# Patient Record
Sex: Male | Born: 1985 | Hispanic: Yes | Smoking: Current some day smoker
Health system: Southern US, Community
[De-identification: ages and names within clinical notes are randomized; demographics above are authoritative.]

## PROBLEM LIST (undated history)

## (undated) DIAGNOSIS — Z789 Other specified health status: Secondary | ICD-10-CM

## (undated) DIAGNOSIS — S82891A Other fracture of right lower leg, initial encounter for closed fracture: Secondary | ICD-10-CM

---

## 2015-12-22 ENCOUNTER — Emergency Department (HOSPITAL_COMMUNITY): Payer: Self-pay

## 2015-12-22 ENCOUNTER — Emergency Department (HOSPITAL_COMMUNITY)
Admission: EM | Admit: 2015-12-22 | Discharge: 2015-12-22 | Disposition: A | Discharge status: Home / Self Care | Payer: Self-pay | Attending: Emergency Medicine | Admitting: Emergency Medicine

## 2015-12-22 ENCOUNTER — Encounter (HOSPITAL_COMMUNITY): Payer: Self-pay | Admitting: Emergency Medicine

## 2015-12-22 DIAGNOSIS — R609 Edema, unspecified: Secondary | ICD-10-CM

## 2015-12-22 DIAGNOSIS — F17200 Nicotine dependence, unspecified, uncomplicated: Secondary | ICD-10-CM | POA: Insufficient documentation

## 2015-12-22 DIAGNOSIS — S82891A Other fracture of right lower leg, initial encounter for closed fracture: Secondary | ICD-10-CM

## 2015-12-22 DIAGNOSIS — Q899 Congenital malformation, unspecified: Secondary | ICD-10-CM

## 2015-12-22 DIAGNOSIS — R52 Pain, unspecified: Secondary | ICD-10-CM

## 2015-12-22 DIAGNOSIS — Y9389 Activity, other specified: Secondary | ICD-10-CM | POA: Insufficient documentation

## 2015-12-22 DIAGNOSIS — Y9289 Other specified places as the place of occurrence of the external cause: Secondary | ICD-10-CM | POA: Insufficient documentation

## 2015-12-22 DIAGNOSIS — Y998 Other external cause status: Secondary | ICD-10-CM | POA: Insufficient documentation

## 2015-12-22 DIAGNOSIS — X58XXXA Exposure to other specified factors, initial encounter: Secondary | ICD-10-CM | POA: Insufficient documentation

## 2015-12-22 DIAGNOSIS — S82831A Other fracture of upper and lower end of right fibula, initial encounter for closed fracture: Secondary | ICD-10-CM | POA: Insufficient documentation

## 2015-12-22 MED ORDER — PROPOFOL 10 MG/ML IV BOLUS
200.0000 mg | Freq: Once | INTRAVENOUS | Status: AC
  Administered 2015-12-22: 200 mg via INTRAVENOUS
  Filled 2015-12-22: qty 20

## 2015-12-22 MED ORDER — OXYCODONE-ACETAMINOPHEN 5-325 MG PO TABS: 1.0000 | ORAL_TABLET | Freq: Four times a day (QID) | ORAL | Status: DC | PRN

## 2015-12-22 MED ORDER — MORPHINE SULFATE (PF) 4 MG/ML IV SOLN
4.0000 mg | Freq: Once | INTRAVENOUS | Status: DC
  Filled 2015-12-22: qty 1

## 2015-12-22 NOTE — ED Notes (Signed)
Pt ambulated with ortho tech to door and back, became dizzy and nauseated. Asking for ice water-- sips given.

## 2015-12-22 NOTE — ED Notes (Signed)
Deformity of rt ankle noted good pedal pulse foot color good.  Pt sleeping

## 2015-12-22 NOTE — Progress Notes (Signed)
Orthopedic Tech Progress Note Patient Details:  Grant Leon 06-Feb-1986 [id number]  Ortho Devices Type of Ortho Device: Crutches Ortho Device/Splint Interventions: Application   [name] 12/22/2015, 9:08 AM
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
## 2015-12-22 NOTE — ED Notes (Signed)
The pt did not sign a procedure permit  Too intoxicated  To follow instructions.  Sleeping even though he had no medications until  He was given propofol  To reduce the rt ankle fracture

## 2015-12-22 NOTE — Discharge Instructions (Signed)
Elevate your legs. Use ice packs to keep the swelling down. Leave the splint in place until you are rechecked by the orthopedist. Do not put weight on the splint or it will break. Use the crutches to get around. Takes Percocet for pain as needed. Call the orthopedic office Monday morning to get an appointment to see him that day. You will need surgery to repair your ankle.   Cuidados del yeso o la frula (Cast or Splint Care) El yeso y las frulas sostienen los miembros lesionados y evitan que los huesos se muevan hasta que se curen.  CUIDADOS EN EL HOGAR  Mantenga el yeso o la frula al descubierto durante el tiempo de secado.  El yeso tarda entre 14 y 48 horas en secarse.  La fibra de vidrio se seca en menos de 1 hora.  No apoye el yeso sobre nada que sea ms duro que una almohada durante 24 horas.  No soporte ningn peso sobre el [employer]. No haga presin sobre el yeso. Espere a que el mdico lo autorice.  Mantenga el yeso o la frula secos.  Cbralos con una bolsa plstica cuando se d un bao o los [address]  [address] de [place].  Si tiene Corporate treasurer trax y la cintura (el tronco) bese con una esponja hasta que se lo quiten.  Si el yeso se moja, squelo con una toalla o un secador de cabello. Utilice el aire fro del secador.  Mantenga el yeso o la frula limpios. Limpie el yeso sucio con un pao hmedo.  Noponga objetos extraos debajo del yeso o de la frula.  No se rasque la piel por debajo del molde con ningn objeto. Si siente picazn, use un secador de cabello con aire fro sobre la zona que pica.  No recorte ni perfore el yeso.  No retire el relleno acolchado que se encuentra debajo del yeso.  Ejercite como le ha indicado el mdico las articulaciones que se encuentran cerca del yeso.  Eleve (levante) el miembro lesionado sobre 1  2 almohadas durante los primeros 1 a 3 das. SOLICITE AYUDA SI:  El yeso o la frula se quiebran.  Siente que el yeso o la frula  estn muy apretados o muy flojos.  Siente una picazn intensa por debajo del yeso.  El yeso se moja o tiene una zona blanda.  Siente un feo [employer] proviene del interior del [place].  Algn objeto se queda atascado bajo el yeso.  La piel que rodea el yeso enrojece o se vuelve sensible.  Le aparece dolor, o siente ms dolor luego de la colocacin del yeso. SOLICITE AYUDA DE INMEDIATO SI:  Observa un lquido que sale por el yeso.  No puede mover los dedos.  Los dedos estn de color azul o blanco, estn fros, le duelen y estn inflamados (hinchados).  Siente hormigueo o pierde la sensibilidad (adormecimiento) alrededor de la zona de la lesin.  Aumenta el dolor o la presin debajo del yeso.  Tiene problemas para respirar o Company secretary.  Siente dolor en el pecho.   Esta informacin no tiene Theme park manager el consejo del mdico. Asegrese de hacerle al mdico cualquier pregunta que tenga.   Document Released: 04/28/2011 Document Revised: 07/27/2013 Elsevier Interactive Patient Education 2016 [employer].  Crioterapia  (Cryotherapy)  La crioterapia consiste en aplicar hielo en una lesin. El hielo ayuda a Teacher, early years/pre y la hinchazn despus de una lesin. Hace ms efecto cuando si se comienza  a usar en las primeras 24 a 48 horas.  CUIDADOS EN EL HOGAR   Ponga una toalla seca o hmeda entre el hielo y la piel.  Puede presionar suavemente sobre el hielo.  Deje el hielo no ms de 10 a 20 minutos a una hora.  Revise la piel despus de 5 minutos para asegurarse de que est bien.  Descanse al menos 20 minutos entre las aplicaciones de hielo.  Suspenda el uso si la piel pierde la sensibilidad (adormecimiento).  No use hielo en alguien que no pueda decir cuando le duele. Aqu se incluye a los nios pequeos y a las personas con problemas de memoria (demencia). SOLICITE AYUDA DE INMEDIATO SI:   Tiene manchas blancas en la piel.  La piel est azul o  plida.  Siente que la piel est dura o similar a la cera.  La hinchazn empeora. ASEGRESE DE QUE:   Comprende estas instrucciones.  Controlar su enfermedad.  Solicitar ayuda de inmediato si no mejora o si empeora.   Esta informacin no tiene Theme park managercomo fin reemplazar el consejo del mdico. Asegrese de hacerle al mdico cualquier pregunta que tenga.   Document Released: 11/13/2011 Document Revised: 02/16/2012 Elsevier Interactive Patient Education [employer] Elsevier Inc.

## 2015-12-22 NOTE — ED Notes (Signed)
Pt s ride arrives and discharge instructions given through interpretor. Pt denies questions about crutches. And repeats through interpretor that he will not put weight on this ankle.

## 2015-12-22 NOTE — ED Notes (Signed)
Respirations  Listed on vitals at this time is listed as 10,.    Not accurate  R.  16
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
## 2015-12-22 NOTE — ED Notes (Signed)
Pt opens eyes when name called otherwise sleeping

## 2015-12-22 NOTE — ED Notes (Signed)
The pt was unable to sign a consent for procedure because he was so intoxicated he was asleep  Without any medication given.  He would respond if his name was called loudly only..Marland Kitchen

## 2015-12-22 NOTE — ED Notes (Signed)
More alert at present  Rt leg had fallwn off the pillow  Replaced on pillow with ice packs.,  Pt fell back to sleep immediately

## 2015-12-22 NOTE — ED Notes (Signed)
Portable xrays post reduction   Toes pink good capillary refill  Casted and ice packs placed

## 2015-12-22 NOTE — ED Notes (Signed)
Pt awake asking for the urinal  Otherwise sleeping

## 2015-12-22 NOTE — ED Notes (Signed)
Cap refill of toes immediate. Pt awake and calling for ride.

## 2015-12-22 NOTE — ED Notes (Addendum)
Dr Lynelle Doctorknapp gave 60mg  iv push  yjen 40mg  iv push  Then 20mg  iv push  Over 3minutes  sats 100%

## 2015-12-24 ENCOUNTER — Other Ambulatory Visit (HOSPITAL_COMMUNITY): Payer: Self-pay | Admitting: Orthopedic Surgery

## 2015-12-26 ENCOUNTER — Encounter (HOSPITAL_COMMUNITY): Payer: Self-pay | Admitting: *Deleted

## 2015-12-26 NOTE — Progress Notes (Signed)
Pt SDW -pre-op call completed by pt and [employer], Mindy # [phone]. Pt denies SOB, chest pain, and being under the care of a cardiologist. Pt denies having a stress test, echo and cardiac cath. Pt denies having a chest x ray within the last year. Pt made aware to stop  taking Aspirin, otc vitamins, fish oil and herbal medications. Do not take any NSAIDs ie: Ibuprofen, Advil, Naproxen or any medication containing Aspirin. Pt verbalized understanding of all pre-op instructions. Anesthesia to review EKG.

## 2015-12-27 ENCOUNTER — Ambulatory Visit (HOSPITAL_COMMUNITY): Payer: Self-pay

## 2015-12-27 ENCOUNTER — Encounter (HOSPITAL_COMMUNITY): Payer: Self-pay | Admitting: *Deleted

## 2015-12-27 ENCOUNTER — Observation Stay (HOSPITAL_COMMUNITY)
Admission: RE | Admit: 2015-12-27 | Discharge: 2015-12-28 | Disposition: A | Discharge status: Home / Self Care | Payer: Self-pay | Source: Ambulatory Visit | Attending: Orthopedic Surgery | Admitting: Orthopedic Surgery

## 2015-12-27 ENCOUNTER — Ambulatory Visit (HOSPITAL_COMMUNITY): Payer: Self-pay | Admitting: Emergency Medicine

## 2015-12-27 ENCOUNTER — Observation Stay (HOSPITAL_COMMUNITY): Payer: Self-pay

## 2015-12-27 ENCOUNTER — Encounter (HOSPITAL_COMMUNITY)
Admission: RE | Disposition: A | Discharge status: Home / Self Care | Payer: Self-pay | Source: Ambulatory Visit | Attending: Orthopedic Surgery

## 2015-12-27 DIAGNOSIS — S8263XA Displaced fracture of lateral malleolus of unspecified fibula, initial encounter for closed fracture: Principal | ICD-10-CM | POA: Insufficient documentation

## 2015-12-27 DIAGNOSIS — F17210 Nicotine dependence, cigarettes, uncomplicated: Secondary | ICD-10-CM | POA: Insufficient documentation

## 2015-12-27 DIAGNOSIS — S82899A Other fracture of unspecified lower leg, initial encounter for closed fracture: Secondary | ICD-10-CM | POA: Diagnosis present

## 2015-12-27 DIAGNOSIS — S82891A Other fracture of right lower leg, initial encounter for closed fracture: Secondary | ICD-10-CM

## 2015-12-27 DIAGNOSIS — X58XXXA Exposure to other specified factors, initial encounter: Secondary | ICD-10-CM | POA: Insufficient documentation

## 2015-12-27 HISTORY — PX: ORIF ANKLE FRACTURE: SHX5408

## 2015-12-27 HISTORY — DX: Other fracture of right lower leg, initial encounter for closed fracture: S82.891A

## 2015-12-27 SURGERY — OPEN REDUCTION INTERNAL FIXATION (ORIF) ANKLE FRACTURE
Anesthesia: General | Site: Ankle | Laterality: Right

## 2015-12-27 MED ORDER — CHLORHEXIDINE GLUCONATE 4 % EX LIQD: 60.0000 mL | Freq: Once | CUTANEOUS | Status: DC

## 2015-12-27 MED ORDER — ONDANSETRON HCL 4 MG/2ML IJ SOLN
INTRAMUSCULAR | Status: DC | PRN
  Administered 2015-12-27: 4 mg via INTRAVENOUS

## 2015-12-27 MED ORDER — SODIUM CHLORIDE 0.9 % IJ SOLN
INTRAMUSCULAR | Status: AC
  Filled 2015-12-27: qty 10

## 2015-12-27 MED ORDER — 0.9 % SODIUM CHLORIDE (POUR BTL) OPTIME
TOPICAL | Status: DC | PRN
  Administered 2015-12-27 (×2): 1000 mL

## 2015-12-27 MED ORDER — PROPOFOL 10 MG/ML IV BOLUS
INTRAVENOUS | Status: AC
  Filled 2015-12-27: qty 20

## 2015-12-27 MED ORDER — CEFAZOLIN SODIUM-DEXTROSE 2-3 GM-% IV SOLR
INTRAVENOUS | Status: AC
  Filled 2015-12-27: qty 50

## 2015-12-27 MED ORDER — SUCCINYLCHOLINE CHLORIDE 20 MG/ML IJ SOLN
INTRAMUSCULAR | Status: AC
  Filled 2015-12-27: qty 1

## 2015-12-27 MED ORDER — LACTATED RINGERS IV SOLN
INTRAVENOUS | Status: DC | PRN
  Administered 2015-12-27: 21:00:00 via INTRAVENOUS

## 2015-12-27 MED ORDER — ARTIFICIAL TEARS OP OINT
TOPICAL_OINTMENT | OPHTHALMIC | Status: AC
  Filled 2015-12-27: qty 3.5

## 2015-12-27 MED ORDER — EPHEDRINE SULFATE 50 MG/ML IJ SOLN
INTRAMUSCULAR | Status: AC
  Filled 2015-12-27: qty 1

## 2015-12-27 MED ORDER — MIDAZOLAM HCL 2 MG/2ML IJ SOLN
INTRAMUSCULAR | Status: AC
  Filled 2015-12-27: qty 2

## 2015-12-27 MED ORDER — ONDANSETRON HCL 4 MG/2ML IJ SOLN
INTRAMUSCULAR | Status: AC
  Filled 2015-12-27: qty 2

## 2015-12-27 MED ORDER — CEFAZOLIN SODIUM-DEXTROSE 2-3 GM-% IV SOLR
INTRAVENOUS | Status: DC | PRN
  Administered 2015-12-27: 2 g via INTRAVENOUS

## 2015-12-27 MED ORDER — DEXAMETHASONE SODIUM PHOSPHATE 4 MG/ML IJ SOLN
INTRAMUSCULAR | Status: AC
  Filled 2015-12-27: qty 1

## 2015-12-27 MED ORDER — LIDOCAINE HCL (CARDIAC) 20 MG/ML IV SOLN
INTRAVENOUS | Status: AC
  Filled 2015-12-27: qty 5

## 2015-12-27 MED ORDER — ROCURONIUM BROMIDE 50 MG/5ML IV SOLN
INTRAVENOUS | Status: AC
  Filled 2015-12-27: qty 1

## 2015-12-27 MED ORDER — SUFENTANIL CITRATE 50 MCG/ML IV SOLN
INTRAVENOUS | Status: DC | PRN
  Administered 2015-12-27 (×2): 10 ug via INTRAVENOUS
  Administered 2015-12-27: 5 ug via INTRAVENOUS

## 2015-12-27 MED ORDER — PROPOFOL 10 MG/ML IV BOLUS
INTRAVENOUS | Status: DC | PRN
  Administered 2015-12-27: 200 mg via INTRAVENOUS

## 2015-12-27 MED ORDER — LIDOCAINE HCL (CARDIAC) 20 MG/ML IV SOLN
INTRAVENOUS | Status: DC | PRN
  Administered 2015-12-27: 100 mg via INTRAVENOUS

## 2015-12-27 MED ORDER — LACTATED RINGERS IV SOLN: INTRAVENOUS | Status: DC

## 2015-12-27 MED ORDER — CEFAZOLIN SODIUM-DEXTROSE 2-3 GM-% IV SOLR: 2.0000 g | INTRAVENOUS | Status: DC

## 2015-12-27 MED ORDER — SUFENTANIL CITRATE 50 MCG/ML IV SOLN
INTRAVENOUS | Status: AC
  Filled 2015-12-27: qty 1

## 2015-12-27 MED ORDER — MIDAZOLAM HCL 5 MG/5ML IJ SOLN
INTRAMUSCULAR | Status: DC | PRN
  Administered 2015-12-27: 2 mg via INTRAVENOUS

## 2015-12-27 MED ORDER — DEXAMETHASONE SODIUM PHOSPHATE 4 MG/ML IJ SOLN
INTRAMUSCULAR | Status: DC | PRN
  Administered 2015-12-27: 4 mg via INTRAVENOUS

## 2015-12-27 MED ORDER — FENTANYL CITRATE (PF) 250 MCG/5ML IJ SOLN
INTRAMUSCULAR | Status: AC
  Filled 2015-12-27: qty 5

## 2015-12-27 SURGICAL SUPPLY — 76 items
BANDAGE ACE 4X5 VEL STRL LF (GAUZE/BANDAGES/DRESSINGS) ×3 IMPLANT
BANDAGE ACE 6X5 VEL STRL LF (GAUZE/BANDAGES/DRESSINGS) ×3 IMPLANT
BANDAGE ELASTIC 4 VELCRO ST LF (GAUZE/BANDAGES/DRESSINGS) IMPLANT
BANDAGE ELASTIC 6 VELCRO ST LF (GAUZE/BANDAGES/DRESSINGS) IMPLANT
BIT DRILL 3.5 QC 155 (BIT) ×2 IMPLANT
BIT DRILL 3.5 QC 155MM (BIT) ×1
BIT DRILL QC 2.7 6.3IN  SHORT (BIT) ×4
BIT DRILL QC 2.7 6.3IN SHORT (BIT) ×2 IMPLANT
BLADE SURG 10 STRL SS (BLADE) IMPLANT
BNDG COHESIVE 6X5 TAN STRL LF (GAUZE/BANDAGES/DRESSINGS) ×3 IMPLANT
BNDG ESMARK 4X9 LF (GAUZE/BANDAGES/DRESSINGS) ×3 IMPLANT
BNDG GAUZE ELAST 4 BULKY (GAUZE/BANDAGES/DRESSINGS) IMPLANT
COVER MAYO STAND STRL (DRAPES) ×3 IMPLANT
COVER SURGICAL LIGHT HANDLE (MISCELLANEOUS) ×3 IMPLANT
CUFF TOURNIQUET SINGLE 34IN LL (TOURNIQUET CUFF) IMPLANT
CUFF TOURNIQUET SINGLE 44IN (TOURNIQUET CUFF) IMPLANT
DRAPE C-ARM 42X72 X-RAY (DRAPES) ×3 IMPLANT
DRAPE INCISE IOBAN 66X45 STRL (DRAPES) ×3 IMPLANT
DRAPE SURG 17X23 STRL (DRAPES) IMPLANT
DRAPE U-SHAPE 47X51 STRL (DRAPES) ×3 IMPLANT
DRSG PAD ABDOMINAL 8X10 ST (GAUZE/BANDAGES/DRESSINGS) ×18 IMPLANT
DURAPREP 26ML APPLICATOR (WOUND CARE) ×3 IMPLANT
ELECT REM PT RETURN 9FT ADLT (ELECTROSURGICAL) ×3
ELECTRODE REM PT RTRN 9FT ADLT (ELECTROSURGICAL) ×1 IMPLANT
FACESHIELD WRAPAROUND (MASK) ×3 IMPLANT
GAUZE SPONGE 4X4 12PLY STRL (GAUZE/BANDAGES/DRESSINGS) ×3 IMPLANT
GAUZE XEROFORM 5X9 LF (GAUZE/BANDAGES/DRESSINGS) ×3 IMPLANT
GLOVE BIOGEL PI IND STRL 8 (GLOVE) ×1 IMPLANT
GLOVE BIOGEL PI INDICATOR 8 (GLOVE) ×2
GLOVE SURG ORTHO 8.0 STRL STRW (GLOVE) ×3 IMPLANT
GOWN STRL REUS W/ TWL LRG LVL3 (GOWN DISPOSABLE) ×3 IMPLANT
GOWN STRL REUS W/TWL LRG LVL3 (GOWN DISPOSABLE) ×6
HANDPIECE INTERPULSE COAX TIP (DISPOSABLE)
KIT BASIN OR (CUSTOM PROCEDURE TRAY) ×3 IMPLANT
KIT ROOM TURNOVER OR (KITS) ×3 IMPLANT
MANIFOLD NEPTUNE II (INSTRUMENTS) IMPLANT
NEEDLE HYPO 25GX1X1/2 BEV (NEEDLE) ×3 IMPLANT
NS IRRIG 1000ML POUR BTL (IV SOLUTION) ×3 IMPLANT
PACK ORTHO EXTREMITY (CUSTOM PROCEDURE TRAY) ×3 IMPLANT
PAD ARMBOARD 7.5X6 YLW CONV (MISCELLANEOUS) ×6 IMPLANT
PAD CAST 4YDX4 CTTN HI CHSV (CAST SUPPLIES) IMPLANT
PADDING CAST ABS 4INX4YD NS (CAST SUPPLIES) ×4
PADDING CAST ABS 6INX4YD NS (CAST SUPPLIES) ×4
PADDING CAST ABS COTTON 4X4 ST (CAST SUPPLIES) ×2 IMPLANT
PADDING CAST ABS COTTON 6X4 NS (CAST SUPPLIES) ×2 IMPLANT
PADDING CAST COTTON 4X4 STRL (CAST SUPPLIES)
PLATE FIBULA DISTAL 7 HOLE (Plate) ×3 IMPLANT
SCREW 5.0X12 (Screw) ×3 IMPLANT
SCREW LOCK 10X3.5XST NS (Screw) ×2 IMPLANT
SCREW LOCK 12X3.5XST PRLC (Screw) ×1 IMPLANT
SCREW LOCK 3.5X10 (Screw) ×4 IMPLANT
SCREW LOCK 3.5X12 (Screw) ×2 IMPLANT
SCREW NL 3.5X14 (Screw) ×3 IMPLANT
SCREW NL 3.5X50 (Screw) ×3 IMPLANT
SCREW NL 3.5X55 (Screw) ×3 IMPLANT
SCREW NLCK 16X3.5XST CORT PRLC (Screw) ×1 IMPLANT
SCREW NON LOCK 3.5X12 (Screw) ×9 IMPLANT
SCREW NON LOCKING 3.5X48 (Screw) ×3 IMPLANT
SCREW NONLOCK 3.5X16 (Screw) ×2 IMPLANT
SCREW NONLOCK 3.5X18 (Screw) ×3 IMPLANT
SET HNDPC FAN SPRY TIP SCT (DISPOSABLE) IMPLANT
SPLINT PLASTER CAST XFAST 5X30 (CAST SUPPLIES) ×1 IMPLANT
SPLINT PLASTER XFAST SET 5X30 (CAST SUPPLIES) ×2
STOCKINETTE IMPERVIOUS 9X36 MD (GAUZE/BANDAGES/DRESSINGS) ×3 IMPLANT
SUCTION FRAZIER TIP 10 FR DISP (SUCTIONS) ×3 IMPLANT
SUT ETHILON 3 0 PS 1 (SUTURE) ×12 IMPLANT
SUT VIC AB 2-0 CTB1 (SUTURE) ×9 IMPLANT
SUT VIC AB 3-0 SH 27 (SUTURE) ×4
SUT VIC AB 3-0 SH 27X BRD (SUTURE) ×2 IMPLANT
SYR CONTROL 10ML LL (SYRINGE) ×3 IMPLANT
TOWEL OR 17X24 6PK STRL BLUE (TOWEL DISPOSABLE) ×3 IMPLANT
TOWEL OR 17X26 10 PK STRL BLUE (TOWEL DISPOSABLE) ×3 IMPLANT
TUBE CONNECTING 12'X1/4 (SUCTIONS) ×1
TUBE CONNECTING 12X1/4 (SUCTIONS) ×2 IMPLANT
WATER STERILE IRR 1000ML POUR (IV SOLUTION) ×3 IMPLANT
YANKAUER SUCT BULB TIP NO VENT (SUCTIONS) IMPLANT

## 2015-12-27 NOTE — Anesthesia Procedure Notes (Addendum)
Anesthesia Regional Block:  Popliteal block  Pre-Anesthetic Checklist: ,, timeout performed, Correct Patient, Correct Site, Correct Laterality, Correct Procedure, Correct Position, site marked, Risks and benefits discussed,  Surgical consent,  Pre-op evaluation,  At surgeon's request and post-op pain management  Laterality: Right  Prep: chloraprep       Needles:  Injection technique: Single-shot  Needle Type: Echogenic Stimulator Needle     Needle Length: 9cm 9 cm Needle Gauge: 21 and 21 G    Additional Needles:  Procedures: ultrasound guided (picture in chart) Popliteal block Narrative:  Start time: 12/27/2015 9:25 PM End time: 12/27/2015 9:30 PM Injection made incrementally with aspirations every 5 mL.  Performed by: Personally   Additional Notes: 30 cc 0.5% Bupivacaine with 1:200 epi injected easily   Procedure Name: LMA Insertion Date/Time: 12/27/2015 10:00 PM Performed by: Melina Schools Pre-anesthesia Checklist: Patient identified, Emergency Drugs available, Suction available, Patient being monitored and Timeout performed Patient Re-evaluated:Patient Re-evaluated prior to inductionOxygen Delivery Method: Circle system utilized Preoxygenation: Pre-oxygenation with 100% oxygen Intubation Type: IV induction Ventilation: Mask ventilation without difficulty LMA: LMA inserted LMA Size: 4.0 Number of attempts: 2 Placement Confirmation: positive ETCO2 and breath sounds checked- equal and bilateral Tube secured with: Tape Dental Injury: Teeth and Oropharynx as per pre-operative assessment
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
## 2015-12-27 NOTE — Progress Notes (Signed)
All information obtained through interpreter line, spoke with [place], [place] #[id number].

## 2015-12-27 NOTE — Brief Op Note (Signed)
12/27/2015  11:38 PM  PATIENT:  Grant Leon  30 y.o. male  PRE-OPERATIVE DIAGNOSIS:  RIGHT ANKLE FRACTURE  POST-OPERATIVE DIAGNOSIS:  RIGHT ANKLE FRACTURE  PROCEDURE:  Procedure(s): OPEN REDUCTION INTERNAL FIXATION (ORIF) ANKLE FRACTURE  SURGEON:  Surgeon(s): Cammy Copa, MD  ASSISTANT: April Green RNFA  ANESTHESIA:   general  EBL: 45 ml    Total I/O In: 900 [I.V.:900] Out: -   BLOOD ADMINISTERED: none  DRAINS: none   LOCAL MEDICATIONS USED:  none  SPECIMEN:  No Specimen  COUNTS:  YES  TOURNIQUET:    DICTATION: .Other Dictation: Dictation Number [id number]  PLAN OF CARE: Admit for overnight observation  PATIENT DISPOSITION:  PACU - hemodynamically stable

## 2015-12-27 NOTE — Transfer of Care (Signed)
Immediate Anesthesia Transfer of Care Note  Patient: Grant Leon  Procedure(s) Performed: Procedure(s): OPEN REDUCTION INTERNAL FIXATION (ORIF) ANKLE FRACTURE (Right)  Patient Location: PACU  Anesthesia Type:GA combined with regional for post-op pain  Level of Consciousness: awake, alert  and patient cooperative  Airway & Oxygen Therapy: Patient Spontanous Breathing and Patient connected to nasal cannula oxygen  Post-op Assessment: Report given to RN, Post -op Vital signs reviewed and stable and Patient moving all extremities X 4  Post vital signs: Reviewed and stable  Last Vitals:  Filed Vitals:   12/27/15 1859  BP: 135/86  Pulse: 73  Temp: 37.2 C  Resp: 18    Complications: No apparent anesthesia complications

## 2015-12-27 NOTE — Anesthesia Preprocedure Evaluation (Addendum)
Anesthesia Evaluation    Reviewed: Allergy & Precautions, NPO status , Patient's Chart, lab work & pertinent test results  History of Anesthesia Complications Negative for: history of anesthetic complications  Airway Mallampati: II  TM Distance: >3 FB Neck ROM: Full    Dental  (+) Teeth Intact, Dental Advisory Given   Pulmonary Current Smoker,    breath sounds clear to auscultation       Cardiovascular Exercise Tolerance: Good negative cardio ROS   Rhythm:Regular Rate:Normal     Neuro/Psych negative neurological ROS  negative psych ROS   GI/Hepatic negative GI ROS, Neg liver ROS,   Endo/Other    Renal/GU negative Renal ROS     Musculoskeletal   Abdominal (+)  Abdomen: soft. Bowel sounds: normal.  Peds  Hematology negative hematology ROS (+)   Anesthesia Other Findings   Reproductive/Obstetrics                           EKG 12/22/15: Sinus rhythm ST elev, probable normal early repol pattern  BP Readings from Last 3 Encounters:  12/22/15 132/98   No results found for: WBC, HGB, HCT, MCV, PLT   Chemistry   No results found for: NA, K, CL, CO2, BUN, CREATININE, GLU No results found for: CALCIUM, ALKPHOS, AST, ALT, BILITOT    Anesthesia Physical Anesthesia Plan  ASA: I  Anesthesia Plan: General   Post-op Pain Management: GA combined w/ Regional for post-op pain   Induction: Intravenous  Airway Management Planned: LMA  Additional Equipment:   Intra-op Plan:   Post-operative Plan:   Informed Consent: I have reviewed the patients History and Physical, chart, labs and discussed the procedure including the risks, benefits and alternatives for the proposed anesthesia with the patient or authorized representative who has indicated his/her understanding and acceptance.   Dental advisory given  Plan Discussed with: CRNA and Anesthesiologist  Anesthesia Plan Comments:          Anesthesia Quick Evaluation

## 2015-12-28 ENCOUNTER — Encounter (HOSPITAL_COMMUNITY): Payer: Self-pay | Admitting: *Deleted

## 2015-12-28 MED ORDER — HYDROMORPHONE HCL 1 MG/ML IJ SOLN
0.2500 mg | INTRAMUSCULAR | Status: DC | PRN
  Administered 2015-12-28: 0.5 mg via INTRAVENOUS

## 2015-12-28 MED ORDER — ACETAMINOPHEN 650 MG RE SUPP: 650.0000 mg | Freq: Four times a day (QID) | RECTAL | Status: DC | PRN

## 2015-12-28 MED ORDER — METHOCARBAMOL 500 MG PO TABS: 500.0000 mg | ORAL_TABLET | Freq: Four times a day (QID) | ORAL | Status: DC | PRN

## 2015-12-28 MED ORDER — ONDANSETRON HCL 4 MG/2ML IJ SOLN: 4.0000 mg | Freq: Once | INTRAMUSCULAR | Status: DC | PRN

## 2015-12-28 MED ORDER — OXYCODONE HCL 5 MG PO TABS: 5.0000 mg | ORAL_TABLET | ORAL | Status: DC | PRN

## 2015-12-28 MED ORDER — MORPHINE SULFATE (PF) 4 MG/ML IV SOLN
4.0000 mg | INTRAVENOUS | Status: DC | PRN
  Administered 2015-12-28: 4 mg via INTRAVENOUS
  Filled 2015-12-28: qty 1

## 2015-12-28 MED ORDER — METHOCARBAMOL 1000 MG/10ML IJ SOLN: 500.0000 mg | Freq: Four times a day (QID) | INTRAVENOUS | Status: DC | PRN

## 2015-12-28 MED ORDER — OXYCODONE HCL 5 MG/5ML PO SOLN: 5.0000 mg | Freq: Once | ORAL | Status: DC | PRN

## 2015-12-28 MED ORDER — RIVAROXABAN 10 MG PO TABS
10.0000 mg | ORAL_TABLET | Freq: Every day | ORAL | Status: DC
  Administered 2015-12-28: 10 mg via ORAL
  Filled 2015-12-28: qty 1

## 2015-12-28 MED ORDER — ONDANSETRON HCL 4 MG/2ML IJ SOLN: 4.0000 mg | Freq: Four times a day (QID) | INTRAMUSCULAR | Status: DC | PRN

## 2015-12-28 MED ORDER — ACETAMINOPHEN 325 MG PO TABS: 650.0000 mg | ORAL_TABLET | Freq: Four times a day (QID) | ORAL | Status: DC | PRN

## 2015-12-28 MED ORDER — HYDROMORPHONE HCL 1 MG/ML IJ SOLN
INTRAMUSCULAR | Status: AC
  Filled 2015-12-28: qty 1

## 2015-12-28 MED ORDER — ONDANSETRON HCL 4 MG PO TABS: 4.0000 mg | ORAL_TABLET | Freq: Four times a day (QID) | ORAL | Status: DC | PRN

## 2015-12-28 MED ORDER — PNEUMOCOCCAL VAC POLYVALENT 25 MCG/0.5ML IJ INJ: 0.5000 mL | INJECTION | INTRAMUSCULAR | Status: DC

## 2015-12-28 MED ORDER — CEFAZOLIN SODIUM 1-5 GM-% IV SOLN
1.0000 g | Freq: Four times a day (QID) | INTRAVENOUS | Status: AC
  Administered 2015-12-28 (×2): 1 g via INTRAVENOUS
  Filled 2015-12-28 (×2): qty 50

## 2015-12-28 MED ORDER — METOCLOPRAMIDE HCL 5 MG/ML IJ SOLN: 5.0000 mg | Freq: Three times a day (TID) | INTRAMUSCULAR | Status: DC | PRN

## 2015-12-28 MED ORDER — METOCLOPRAMIDE HCL 5 MG PO TABS: 5.0000 mg | ORAL_TABLET | Freq: Three times a day (TID) | ORAL | Status: DC | PRN

## 2015-12-28 MED ORDER — POTASSIUM CHLORIDE IN NACL 20-0.9 MEQ/L-% IV SOLN
INTRAVENOUS | Status: AC
  Administered 2015-12-28: 01:00:00 via INTRAVENOUS
  Filled 2015-12-28: qty 1000

## 2015-12-28 MED ORDER — RIVAROXABAN 10 MG PO TABS: 10.0000 mg | ORAL_TABLET | Freq: Every day | ORAL | Status: DC

## 2015-12-28 MED ORDER — OXYCODONE HCL 5 MG PO TABS: 5.0000 mg | ORAL_TABLET | Freq: Once | ORAL | Status: DC | PRN

## 2015-12-28 NOTE — Op Note (Signed)
NAME:  DANYELL, AWBREY NO.:  [id number]  MEDICAL RECORD NO.:  [id number]  LOCATION:  [facility]                         FACILITY:  [facility]  PHYSICIAN:  Burnard Bunting, M.D.    DATE OF BIRTH:  [birth year]  DATE OF PROCEDURE: DATE OF DISCHARGE:                              OPERATIVE REPORT   PREOPERATIVE DIAGNOSIS:  Right ankle lateral malleolar fracture.  POSTOPERATIVE DIAGNOSES:  Right ankle lateral malleolar fracture and syndesmotic disruption.  PROCEDURE:  Open reduction and internal fixation of lateral malleolar ankle fracture and open reduction and internal fixation of syndesmosis.  SURGEON:  Burnard Bunting, M.D.  ASSISTANT:  April Chilton Si, RNFA.  INDICATIONS:  Roxy Filler is a patient with lateral ankle fracture presents now for operative management after explanation of risks, benefits.  PROCEDURE IN DETAIL:  The patient was brought to operating room with general endotracheal anesthesia induced.  Preoperative antibiotics were administered.  Time-out was called.  Right leg was prescrubbed with alcohol and Betadine, allowed to air dry, prepped with DuraPrep solution and draped in sterile manner.  Collier Flowers was used to cover the operative field.  Leg elevated, exsanguinated with Esmarch wrap.  Ankle Esmarch was utilized for approximately an hour.  Lateral incision was made. Skin and subcutaneous tissues sharply divided.  Care was taken to avoid injury to the branch of superficial peroneal nerve.  Lateral malleolar fracture was identified, irrigated, reduced.  Lag screw placed anterior proximal to posterior distal.  Good reduction was achieved.  Lateral plate applied, 4 screws proximal, cluster locking screws distal, syndesmosis stressed and found to be quite unstable.  Two syndesmotic screws were then placed after reducing the syndesmosis.  The syndesmosis was reduced until there was overlap in the clear space on the mortise. Two bicortical screws were then placed.   Initial attempt demonstrated mal-reduced mortise when the screw aimed anteriorly more straighter trajectory gave a better reduction.  At this time, ankle Esmarch released, 3 L of irrigating solution used to irrigate the incision and the incision closed using 2-0 Vicryl suture, 3-0 nylon.  A well-padded posterior splint was applied.  The patient tolerated the procedure well without immediate complication.  Transferred to recovery room in stable condition.     Burnard Bunting, M.D.    GSD/MEDQ  D:  12/27/2015  T:  12/27/2015  Job:  [id number]

## 2015-12-28 NOTE — Progress Notes (Signed)
Pt ready for discharge. Instructions/education reviewed with pt/family member and all questions/concerns adressed. Pt will be transported out via wheelchair to family member's car. Will continue to monitor

## 2015-12-28 NOTE — Anesthesia Postprocedure Evaluation (Signed)
Anesthesia Post Note  Patient: Grant Leon  Procedure(s) Performed: Procedure(s) (LRB): OPEN REDUCTION INTERNAL FIXATION (ORIF) ANKLE FRACTURE (Right)  Patient location during evaluation: PACU Anesthesia Type: General and Regional Level of consciousness: awake and awake and alert Pain management: pain level controlled Vital Signs Assessment: post-procedure vital signs reviewed and stable Respiratory status: spontaneous breathing and nonlabored ventilation Cardiovascular status: blood pressure returned to baseline Anesthetic complications: no    Last Vitals:  Filed Vitals:   12/28/15 0000 12/28/15 0015  BP: 145/99 144/96  Pulse: 92 87  Temp:    Resp: 13 12    Last Pain:  Filed Vitals:   12/28/15 0017  PainSc: 3         RLE Motor Response: Purposeful movement;Responds to commands (12/28/15 0015)        [name] COKER

## 2015-12-28 NOTE — Progress Notes (Signed)
Pt stable Right foot perfused but block in effect Plan dc today
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
## 2016-03-11 ENCOUNTER — Other Ambulatory Visit: Payer: Self-pay | Admitting: Orthopedic Surgery

## 2016-04-07 ENCOUNTER — Inpatient Hospital Stay (HOSPITAL_COMMUNITY)
Admission: RE | Admit: 2016-04-07 | Discharge: 2016-04-07 | Disposition: A | Discharge status: Home / Self Care | Payer: Self-pay | Source: Ambulatory Visit

## 2016-04-07 NOTE — Progress Notes (Signed)
Nurse called patient several times and patients brother. No answer.

## 2016-04-07 NOTE — Progress Notes (Signed)
Grant MountainMark Washburn from language services [facility] Nurse informed Loraine LericheMark that Nurse attempted to call patient several times, however patient did not answer phone. Grant Leon left his direct call back number [phone]) and informed Nurse that he could return at any time if needed.

## 2016-04-14 ENCOUNTER — Encounter (HOSPITAL_COMMUNITY): Payer: Self-pay | Admitting: *Deleted

## 2016-04-14 NOTE — Progress Notes (Signed)
Pre- op call completed using [employer] [employer], [place], [place] # [id number].

## 2016-04-14 NOTE — Anesthesia Preprocedure Evaluation (Addendum)
Anesthesia Evaluation  Patient identified by MRN, date of birth, ID band Patient awake    Reviewed: Allergy & Precautions, NPO status , Patient's Chart, lab work & pertinent test results  Airway Mallampati: III  TM Distance: >3 FB Neck ROM: Full    Dental  (+) Teeth Intact   Pulmonary Current Smoker,    breath sounds clear to auscultation       Cardiovascular negative cardio ROS   Rhythm:Regular Rate:Normal     Neuro/Psych negative neurological ROS  negative psych ROS   GI/Hepatic negative GI ROS, Neg liver ROS,   Endo/Other  negative endocrine ROS  Renal/GU negative Renal ROS  negative genitourinary   Musculoskeletal negative musculoskeletal ROS (+)   Abdominal Normal abdominal exam  (+)   Peds negative pediatric ROS (+)  Hematology negative hematology ROS (+)   Anesthesia Other Findings   Reproductive/Obstetrics negative OB ROS                          12/2015 EKG: NSR  Anesthesia Physical Anesthesia Plan  ASA: II  Anesthesia Plan: General   Post-op Pain Management:    Induction: Intravenous  Airway Management Planned: LMA  Additional Equipment:   Intra-op Plan:   Post-operative Plan: Extubation in OR  Informed Consent: I have reviewed the patients History and Physical, chart, labs and discussed the procedure including the risks, benefits and alternatives for the proposed anesthesia with the patient or authorized representative who has indicated his/her understanding and acceptance.   Dental advisory given  Plan Discussed with: CRNA  Anesthesia Plan Comments:         Anesthesia Quick Evaluation

## 2016-04-15 ENCOUNTER — Encounter (HOSPITAL_COMMUNITY)
Admission: RE | Disposition: A | Discharge status: Home / Self Care | Payer: Self-pay | Source: Ambulatory Visit | Attending: Orthopedic Surgery

## 2016-04-15 ENCOUNTER — Encounter (HOSPITAL_COMMUNITY): Payer: Self-pay | Admitting: General Practice

## 2016-04-15 ENCOUNTER — Ambulatory Visit (HOSPITAL_COMMUNITY)
Admission: RE | Admit: 2016-04-15 | Discharge: 2016-04-15 | Disposition: A | Discharge status: Home / Self Care | Payer: Self-pay | Source: Ambulatory Visit | Attending: Orthopedic Surgery | Admitting: Orthopedic Surgery

## 2016-04-15 ENCOUNTER — Ambulatory Visit (HOSPITAL_COMMUNITY): Payer: Self-pay | Admitting: Anesthesiology

## 2016-04-15 DIAGNOSIS — M25571 Pain in right ankle and joints of right foot: Secondary | ICD-10-CM | POA: Insufficient documentation

## 2016-04-15 DIAGNOSIS — F17210 Nicotine dependence, cigarettes, uncomplicated: Secondary | ICD-10-CM | POA: Insufficient documentation

## 2016-04-15 DIAGNOSIS — T8484XA Pain due to internal orthopedic prosthetic devices, implants and grafts, initial encounter: Secondary | ICD-10-CM | POA: Insufficient documentation

## 2016-04-15 DIAGNOSIS — Y838 Other surgical procedures as the cause of abnormal reaction of the patient, or of later complication, without mention of misadventure at the time of the procedure: Secondary | ICD-10-CM | POA: Insufficient documentation

## 2016-04-15 HISTORY — DX: Other specified health status: Z78.9

## 2016-04-15 HISTORY — PX: HARDWARE REMOVAL: SHX979

## 2016-04-15 LAB — SURGICAL PCR SCREEN
MRSA, PCR: NEGATIVE
Staphylococcus aureus: POSITIVE — AB

## 2016-04-15 SURGERY — REMOVAL, HARDWARE
Anesthesia: General | Laterality: Right

## 2016-04-15 MED ORDER — FENTANYL CITRATE (PF) 250 MCG/5ML IJ SOLN
INTRAMUSCULAR | Status: AC
  Filled 2016-04-15: qty 5

## 2016-04-15 MED ORDER — PROPOFOL 10 MG/ML IV BOLUS
INTRAVENOUS | Status: AC
  Filled 2016-04-15: qty 40

## 2016-04-15 MED ORDER — PROCHLORPERAZINE EDISYLATE 5 MG/ML IJ SOLN: 10.0000 mg | INTRAMUSCULAR | Status: DC | PRN

## 2016-04-15 MED ORDER — FENTANYL CITRATE (PF) 100 MCG/2ML IJ SOLN
INTRAMUSCULAR | Status: DC | PRN
  Administered 2016-04-15: 50 ug via INTRAVENOUS

## 2016-04-15 MED ORDER — CHLORHEXIDINE GLUCONATE 4 % EX LIQD: 60.0000 mL | Freq: Once | CUTANEOUS | Status: DC

## 2016-04-15 MED ORDER — HYDROMORPHONE HCL 1 MG/ML IJ SOLN: 0.2500 mg | INTRAMUSCULAR | Status: DC | PRN

## 2016-04-15 MED ORDER — LACTATED RINGERS IV SOLN
INTRAVENOUS | Status: DC | PRN
  Administered 2016-04-15: 07:00:00 via INTRAVENOUS

## 2016-04-15 MED ORDER — LIDOCAINE 2% (20 MG/ML) 5 ML SYRINGE
INTRAMUSCULAR | Status: AC
  Filled 2016-04-15: qty 5

## 2016-04-15 MED ORDER — LIDOCAINE HCL (CARDIAC) 20 MG/ML IV SOLN
INTRAVENOUS | Status: DC | PRN
  Administered 2016-04-15: 60 mg via INTRAVENOUS

## 2016-04-15 MED ORDER — MIDAZOLAM HCL 2 MG/2ML IJ SOLN
INTRAMUSCULAR | Status: AC
  Filled 2016-04-15: qty 2

## 2016-04-15 MED ORDER — ONDANSETRON HCL 4 MG/2ML IJ SOLN
INTRAMUSCULAR | Status: DC | PRN
  Administered 2016-04-15: 4 mg via INTRAVENOUS

## 2016-04-15 MED ORDER — HYDROCODONE-ACETAMINOPHEN 5-325 MG PO TABS: 1.0000 | ORAL_TABLET | Freq: Four times a day (QID) | ORAL | Status: AC | PRN

## 2016-04-15 MED ORDER — MEPERIDINE HCL 25 MG/ML IJ SOLN: 6.2500 mg | INTRAMUSCULAR | Status: DC | PRN

## 2016-04-15 MED ORDER — CEFAZOLIN SODIUM-DEXTROSE 2-4 GM/100ML-% IV SOLN
INTRAVENOUS | Status: AC
  Filled 2016-04-15: qty 100

## 2016-04-15 MED ORDER — 0.9 % SODIUM CHLORIDE (POUR BTL) OPTIME
TOPICAL | Status: DC | PRN
  Administered 2016-04-15: 1000 mL

## 2016-04-15 MED ORDER — BUPIVACAINE HCL (PF) 0.25 % IJ SOLN
INTRAMUSCULAR | Status: DC | PRN
  Administered 2016-04-15: 5 mL

## 2016-04-15 MED ORDER — CEFAZOLIN SODIUM-DEXTROSE 2-4 GM/100ML-% IV SOLN
2.0000 g | INTRAVENOUS | Status: AC
  Administered 2016-04-15: 2 g via INTRAVENOUS

## 2016-04-15 MED ORDER — ONDANSETRON HCL 4 MG/2ML IJ SOLN
INTRAMUSCULAR | Status: AC
  Filled 2016-04-15: qty 2

## 2016-04-15 MED ORDER — LACTATED RINGERS IV SOLN: INTRAVENOUS | Status: DC

## 2016-04-15 MED ORDER — MIDAZOLAM HCL 5 MG/5ML IJ SOLN
INTRAMUSCULAR | Status: DC | PRN
  Administered 2016-04-15: 0.5 mg via INTRAVENOUS

## 2016-04-15 MED ORDER — MUPIROCIN 2 % EX OINT
1.0000 "application " | TOPICAL_OINTMENT | Freq: Once | CUTANEOUS | Status: AC
  Administered 2016-04-15: 1 via TOPICAL
  Filled 2016-04-15: qty 22

## 2016-04-15 MED ORDER — BUPIVACAINE HCL (PF) 0.25 % IJ SOLN
INTRAMUSCULAR | Status: AC
  Filled 2016-04-15: qty 30

## 2016-04-15 MED ORDER — PROPOFOL 10 MG/ML IV BOLUS
INTRAVENOUS | Status: DC | PRN
  Administered 2016-04-15: 200 mg via INTRAVENOUS
  Administered 2016-04-15: 100 mg via INTRAVENOUS

## 2016-04-15 SURGICAL SUPPLY — 50 items
BANDAGE ELASTIC 4 VELCRO ST LF (GAUZE/BANDAGES/DRESSINGS) ×3 IMPLANT
BANDAGE ELASTIC 6 VELCRO ST LF (GAUZE/BANDAGES/DRESSINGS) IMPLANT
BANDAGE ESMARK 6X9 LF (GAUZE/BANDAGES/DRESSINGS) IMPLANT
BNDG COHESIVE 4X5 TAN STRL (GAUZE/BANDAGES/DRESSINGS) IMPLANT
BNDG ESMARK 6X9 LF (GAUZE/BANDAGES/DRESSINGS)
BNDG GAUZE ELAST 4 BULKY (GAUZE/BANDAGES/DRESSINGS) ×3 IMPLANT
COVER SURGICAL LIGHT HANDLE (MISCELLANEOUS) ×3 IMPLANT
CUFF TOURNIQUET SINGLE 34IN LL (TOURNIQUET CUFF) IMPLANT
CUFF TOURNIQUET SINGLE 44IN (TOURNIQUET CUFF) IMPLANT
DRAPE C-ARM 42X72 X-RAY (DRAPES) IMPLANT
DRAPE EXTREMITY T 121X128X90 (DRAPE) IMPLANT
DRAPE INCISE IOBAN 66X45 STRL (DRAPES) IMPLANT
DRAPE ORTHO SPLIT 77X108 STRL (DRAPES)
DRAPE PROXIMA HALF (DRAPES) ×3 IMPLANT
DRAPE SURG ORHT 6 SPLT 77X108 (DRAPES) IMPLANT
DRAPE U-SHAPE 47X51 STRL (DRAPES) ×3 IMPLANT
DRSG EMULSION OIL 3X3 NADH (GAUZE/BANDAGES/DRESSINGS) ×3 IMPLANT
DRSG PAD ABDOMINAL 8X10 ST (GAUZE/BANDAGES/DRESSINGS) ×3 IMPLANT
DRSG TEGADERM 4X4.75 (GAUZE/BANDAGES/DRESSINGS) ×3 IMPLANT
ELECT REM PT RETURN 9FT ADLT (ELECTROSURGICAL) ×3
ELECTRODE REM PT RTRN 9FT ADLT (ELECTROSURGICAL) ×1 IMPLANT
GAUZE SPONGE 4X4 12PLY STRL (GAUZE/BANDAGES/DRESSINGS) ×3 IMPLANT
GAUZE XEROFORM 1X8 LF (GAUZE/BANDAGES/DRESSINGS) ×3 IMPLANT
GLOVE BIOGEL PI IND STRL 8 (GLOVE) ×1 IMPLANT
GLOVE BIOGEL PI INDICATOR 8 (GLOVE) ×2
GLOVE SURG ORTHO 8.0 STRL STRW (GLOVE) ×3 IMPLANT
GOWN STRL REUS W/ TWL LRG LVL3 (GOWN DISPOSABLE) ×3 IMPLANT
GOWN STRL REUS W/TWL LRG LVL3 (GOWN DISPOSABLE) ×6
KIT BASIN OR (CUSTOM PROCEDURE TRAY) ×3 IMPLANT
KIT ROOM TURNOVER OR (KITS) ×3 IMPLANT
MANIFOLD NEPTUNE II (INSTRUMENTS) ×3 IMPLANT
NS IRRIG 1000ML POUR BTL (IV SOLUTION) ×3 IMPLANT
PACK GENERAL/GYN (CUSTOM PROCEDURE TRAY) ×3 IMPLANT
PAD ARMBOARD 7.5X6 YLW CONV (MISCELLANEOUS) ×6 IMPLANT
PAD CAST 4YDX4 CTTN HI CHSV (CAST SUPPLIES) ×1 IMPLANT
PADDING CAST COTTON 4X4 STRL (CAST SUPPLIES) ×2
PADDING CAST COTTON 6X4 STRL (CAST SUPPLIES) ×3 IMPLANT
STAPLER VISISTAT 35W (STAPLE) ×3 IMPLANT
STOCKINETTE IMPERVIOUS 9X36 MD (GAUZE/BANDAGES/DRESSINGS) IMPLANT
SUCTION FRAZIER HANDLE 10FR (MISCELLANEOUS) ×2
SUCTION TUBE FRAZIER 10FR DISP (MISCELLANEOUS) ×1 IMPLANT
SUT ETHILON 4 0 FS 1 (SUTURE) IMPLANT
SUT VIC AB 0 CT1 27 (SUTURE)
SUT VIC AB 0 CT1 27XBRD ANBCTR (SUTURE) IMPLANT
SUT VIC AB 2-0 CT1 27 (SUTURE)
SUT VIC AB 2-0 CT1 TAPERPNT 27 (SUTURE) IMPLANT
SYR CONTROL 10ML LL (SYRINGE) ×3 IMPLANT
TOWEL OR 17X24 6PK STRL BLUE (TOWEL DISPOSABLE) ×3 IMPLANT
TOWEL OR 17X26 10 PK STRL BLUE (TOWEL DISPOSABLE) ×3 IMPLANT
WATER STERILE IRR 1000ML POUR (IV SOLUTION) ×3 IMPLANT

## 2016-04-15 NOTE — Op Note (Signed)
NAME:  Grant Leon, Vidit                 ACCOUNT NO.:  [id number]  MEDICAL RECORD NO.:  [id number]  LOCATION:  [facility]                         FACILITY:  [facility]  PHYSICIAN:  Burnard BuntingG. Scott Junior Kenedy, M.D.    DATE OF BIRTH:  07/04/1986  DATE OF PROCEDURE: DATE OF DISCHARGE:  04/15/2016                              OPERATIVE REPORT   PREOPERATIVE DIAGNOSIS:  Right ankle retained hardware, syndesmotic fixation.  POSTOPERATIVE DIAGNOSIS:  Right ankle retained hardware, syndesmotic fixation.  PROCEDURE:  Removal of hardware, syndesmotic screw.  SURGEON:  Burnard BuntingG. Scott Kelyn Ponciano, M.D.  ASSISTANT:  None.  ANESTHESIA:  General.  INDICATIONS:  [name] is a 30 year old patient with retained hardware following syndesmotic fixation and ankle fracture fixation 3 months ago, presents for operative management and removal of hardware.  PROCEDURE IN DETAIL:  The patient was brought to the operating room where general anesthetic was induced.  Preoperative antibiotics were administered.  Time-out was called.  Right leg was prescrubbed with alcohol and Betadine, allowed to air dry, prepped with DuraPrep solution and draped in a sterile manner.  Collier Flowersoban was used to cover the operative field.  Ankle Esmarch utilized for approximately 5 minutes.  Under fluoroscopic localization, two screws were localized.  Skin and subcutaneous tissue were divided.  Fascia was divided.  Deep hardware removed from the lateral plate intact.  Thorough irrigation performed. Ankle stress, syndesmosis stable.  Tourniquet ankle Esmarch released. Bleeding points were encountered and controlled using electrocautery. Skin edges were anesthetized using plain Marcaine.  The incision was then closed using 2-0 Vicryl, 3-0 nylon.  Impervious dressing plus Ace wrap applied.  Weightbearing as tolerated postop.  Follow up with me in 10 days for suture removal.     Burnard BuntingG. Scott Koki Buxton, M.D.     GSD/MEDQ  D:  04/15/2016  T:  04/15/2016  Job:  [id number]

## 2016-04-15 NOTE — Anesthesia Procedure Notes (Signed)
Procedure Name: LMA Insertion Date/Time: 04/15/2016 8:44 AM Performed by: Marni GriffonJAMES, Kyera Felan B Pre-anesthesia Checklist: Emergency Drugs available, Patient identified, Suction available and Patient being monitored Patient Re-evaluated:Patient Re-evaluated prior to inductionOxygen Delivery Method: Circle system utilized Preoxygenation: Pre-oxygenation with 100% oxygen Intubation Type: IV induction Ventilation: Mask ventilation without difficulty LMA: LMA inserted LMA Size: 5.0 Number of attempts: 1 Placement Confirmation: positive ETCO2,  breath sounds checked- equal and bilateral and ETT inserted through vocal cords under direct vision Tube secured with: Tape (Taped across cheeks) Dental Injury: Teeth and Oropharynx as per pre-operative assessment

## 2016-04-15 NOTE — Addendum Note (Signed)
Addendum  created 04/15/16 1615 by Marni GriffonKaren B Swannie Milius, CRNA   Modules edited: Anesthesia Events, Narrator   Narrator:  Narrator: Event Log Edited

## 2016-04-15 NOTE — Transfer of Care (Signed)
Immediate Anesthesia Transfer of Care Note  Patient: Grant Leon  Procedure(s) Performed: Procedure(s): RIGHT ANKLE REMOVAL OF HARDWARE (Right)  Patient Location: PACU  Anesthesia Type:General  Level of Consciousness: awake, alert , oriented and patient cooperative  Airway & Oxygen Therapy: Patient Spontanous Breathing and Patient connected to nasal cannula oxygen  Post-op Assessment: Report given to RN, Post -op Vital signs reviewed and stable and Patient moving all extremities  Post vital signs: Reviewed and stable  Last Vitals:  Filed Vitals:   04/15/16 0559  BP: 150/87  Pulse: 66  Temp: 36.8 C  Resp: 20    Last Pain: There were no vitals filed for this visit.    Patients Stated Pain Goal: 2 (04/15/16 [id number])  Complications: No apparent anesthesia complications

## 2016-04-15 NOTE — Brief Op Note (Signed)
04/15/2016  8:26 AM  PATIENT:  Grant Leon  30 y.o. male  PRE-OPERATIVE DIAGNOSIS:  right ankle retained hardware  POST-OPERATIVE DIAGNOSIS:  right ankle retained hardware  PROCEDURE:  Procedure(s): RIGHT ANKLE REMOVAL OF HARDWARE  SURGEON:  Surgeon(s): Cammy CopaScott Bobie Caris, MD  ASSISTANT: Patrick Jupiterarla Bethune rnfa  ANESTHESIA:   general  EBL: 1cc ml       BLOOD ADMINISTERED: none  DRAINS: none   LOCAL MEDICATIONS USED:  none  SPECIMEN:  No Specimen  COUNTS:  YES  TOURNIQUET:  * No tourniquets in log *  DICTATION: .Other Dictation: Dictation Number [id number]  PLAN OF CARE: Discharge to home after PACU  PATIENT DISPOSITION:  PACU - hemodynamically stable

## 2016-04-15 NOTE — H&P (Signed)
Grant Leon is an 30 y.o. male.   Chief Complaint: Right ankle pain HPI: Grant Leon is a 30 year old patient with right ankle fracture.  He had a syndesmotic injury which required fixation with plates and screws.  Patient has been limited in his weightbearing since that time.  Currently the patient presents for removal of syndesmotic screws.  Past Medical History  Diagnosis Date  . Ankle fracture, right   . Medical history non-contributory     Past Surgical History  Procedure Laterality Date  . Orif ankle fracture Right 12/27/2015    Procedure: OPEN REDUCTION INTERNAL FIXATION (ORIF) ANKLE FRACTURE;  Surgeon: Cammy CopaScott Jdyn Parkerson, MD;  Location: [facility] OR;  Service: Orthopedics;  Laterality: Right;    History reviewed. No pertinent family history. Social History:  reports that he has been smoking Cigarettes.  He has a .15 pack-year smoking history. He has never used smokeless tobacco. He reports that he drinks about 4.8 oz of alcohol per week. He reports that he does not use illicit drugs.  Allergies: No Known Allergies  No prescriptions prior to admission    No results found for this or any previous visit (from the past 48 hour(s)). No results found.  Review of Systems  Constitutional: Negative.   HENT: Negative.   Eyes: Negative.   Respiratory: Negative.   Cardiovascular: Negative.   Gastrointestinal: Negative.   Genitourinary: Negative.   Musculoskeletal: Positive for joint pain.  Skin: Negative.   Neurological: Negative.   Endo/Heme/Allergies: Negative.   Psychiatric/Behavioral: Negative.     Blood pressure 150/87, pulse 66, temperature 98.2 F (36.8 C), temperature source Oral, resp. rate 20, height 5\' 5"  (1.651 m), weight 77 kg (169 lb 12.1 oz), SpO2 100 %. Physical Exam  Constitutional: He appears well-developed.  HENT:  Head: Normocephalic.  Eyes: Pupils are equal, round, and reactive to light.  Neck: Normal range of motion.  Cardiovascular: Normal rate.   Respiratory:  Effort normal.  Neurological: He is alert.  Skin: Skin is warm.  Psychiatric: He has a normal mood and affect.   right ankle examination demonstrates palpable pedal pulses generally painless range of motion with ankle dorsiflexion and plantarflexion syndesmosis feel stable.  Incision is intact.   Assessment/Plan Impression is retained hardware status post syndesmotic fixation.  Enough time as a last that the syndesmosis is healed plan is for hardware removal risks and benefits discussed with the patient including but limited to infection or vessel damage potential for further surgery.  All questions answered.  Anticipate weightbearing as tolerated in a soft dressing following procedure  Cammy CopaEAN,Mikias Lanz SCOTT, MD 04/15/2016, 7:25 AM

## 2016-04-15 NOTE — Anesthesia Postprocedure Evaluation (Signed)
Anesthesia Post Note  Patient: Grant Leon  Procedure(s) Performed: Procedure(s) (LRB): RIGHT ANKLE REMOVAL OF HARDWARE (Right)  Patient location during evaluation: PACU Anesthesia Type: General Level of consciousness: awake and alert Pain management: pain level controlled Vital Signs Assessment: post-procedure vital signs reviewed and stable Respiratory status: spontaneous breathing, nonlabored ventilation, respiratory function stable and patient connected to nasal cannula oxygen Cardiovascular status: blood pressure returned to baseline and stable Postop Assessment: no signs of nausea or vomiting Anesthetic complications: no    Last Vitals:  Filed Vitals:   04/15/16 0930 04/15/16 0943  BP: 115/81 124/81  Pulse: 63 63  Temp:    Resp: 10 14    Last Pain:  Filed Vitals:   04/15/16 0946  PainSc: 5                  Shelton SilvasKevin D Lacy Sofia

## 2016-04-16 ENCOUNTER — Encounter (HOSPITAL_COMMUNITY): Payer: Self-pay | Admitting: Orthopedic Surgery

## 2016-12-29 IMAGING — RF DG ANKLE 2V *R*
1 series · 2 of 2 positions shown · non-contrast
Comparison: Right ankle radiographs performed 12/22/2015

CLINICAL DATA: Internal fixation of right ankle fracture. Initial
encounter.

EXAM:
RIGHT ANKLE - 2 VIEW

[Series 1: run · 2 of 2 slices shown]
[im 1/2]
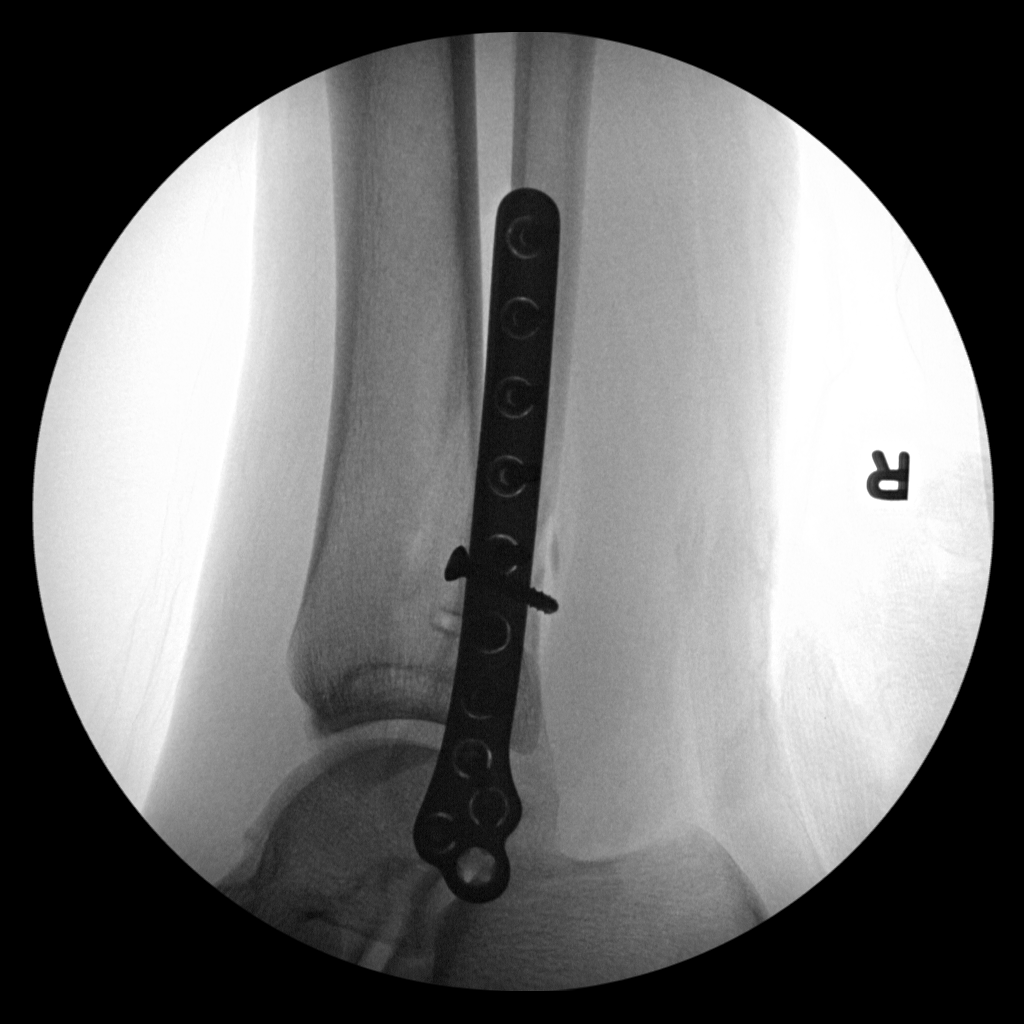
[im 2/2]
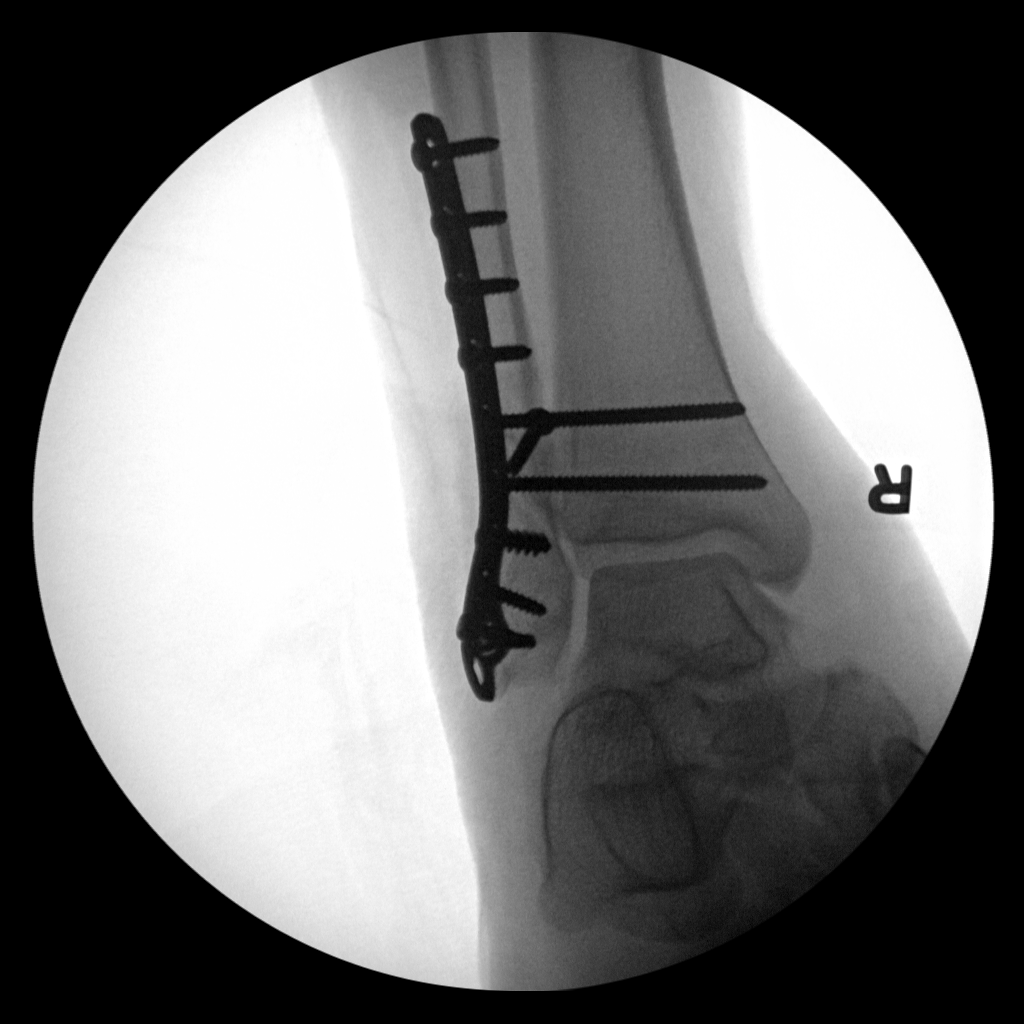

[2 of 2 positions shown; findings below may reference images not displayed]

FINDINGS: A plate and screws are noted along the distal fibula. Screws
transfix the tibiofibular articulation in grossly anatomic
alignment. No new fractures are seen.

The ankle mortise is grossly unremarkable in appearance. Soft tissue
swelling is noted about the ankle.
IMPRESSION: Status post internal fixation of distal fibular fracture in grossly
anatomic alignment.

## 2016-12-30 IMAGING — CR DG ANKLE PORT 2V*R*
2 series · 2 of 2 positions shown · non-contrast
Comparison: Radiograph dated 12/22/2015 and 12/27/2015

CLINICAL DATA: 29-year-old male with open reduction and internal
fixation of the right ankle fracture.

EXAM:
PORTABLE RIGHT ANKLE - 2 VIEW

[AP]
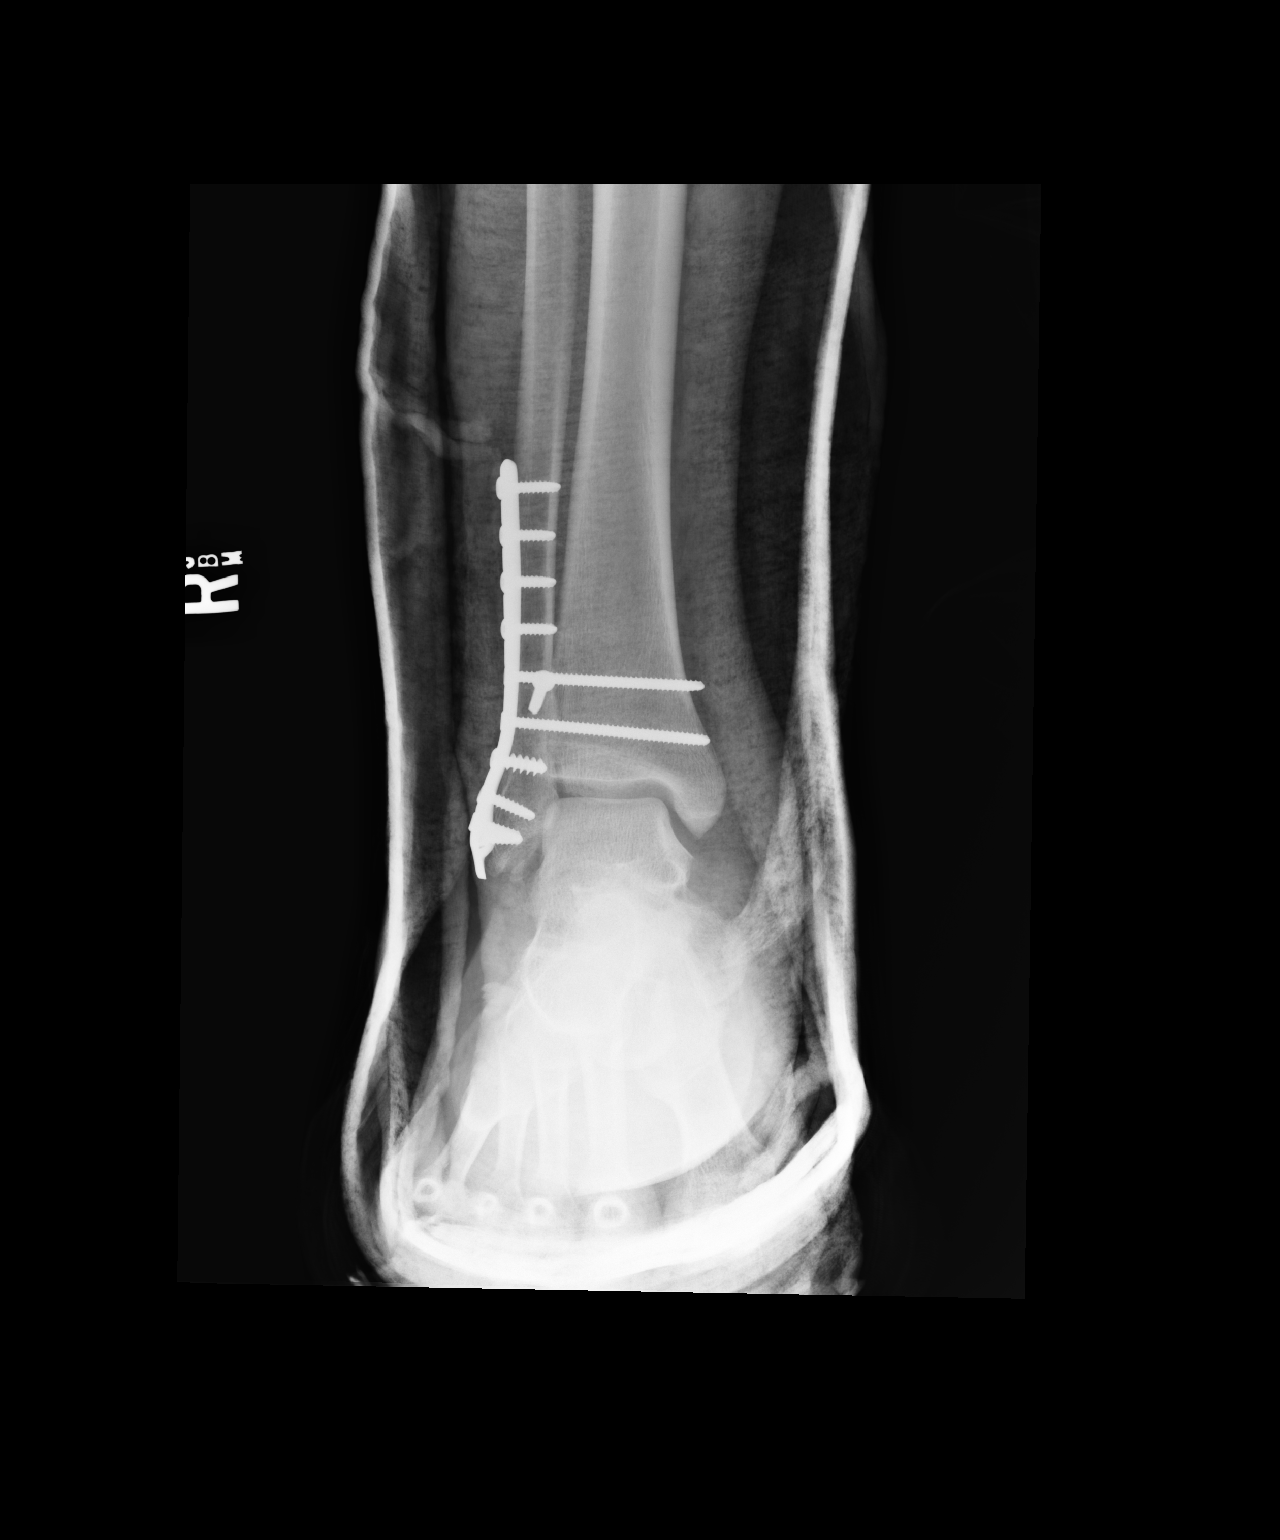

[lateral]
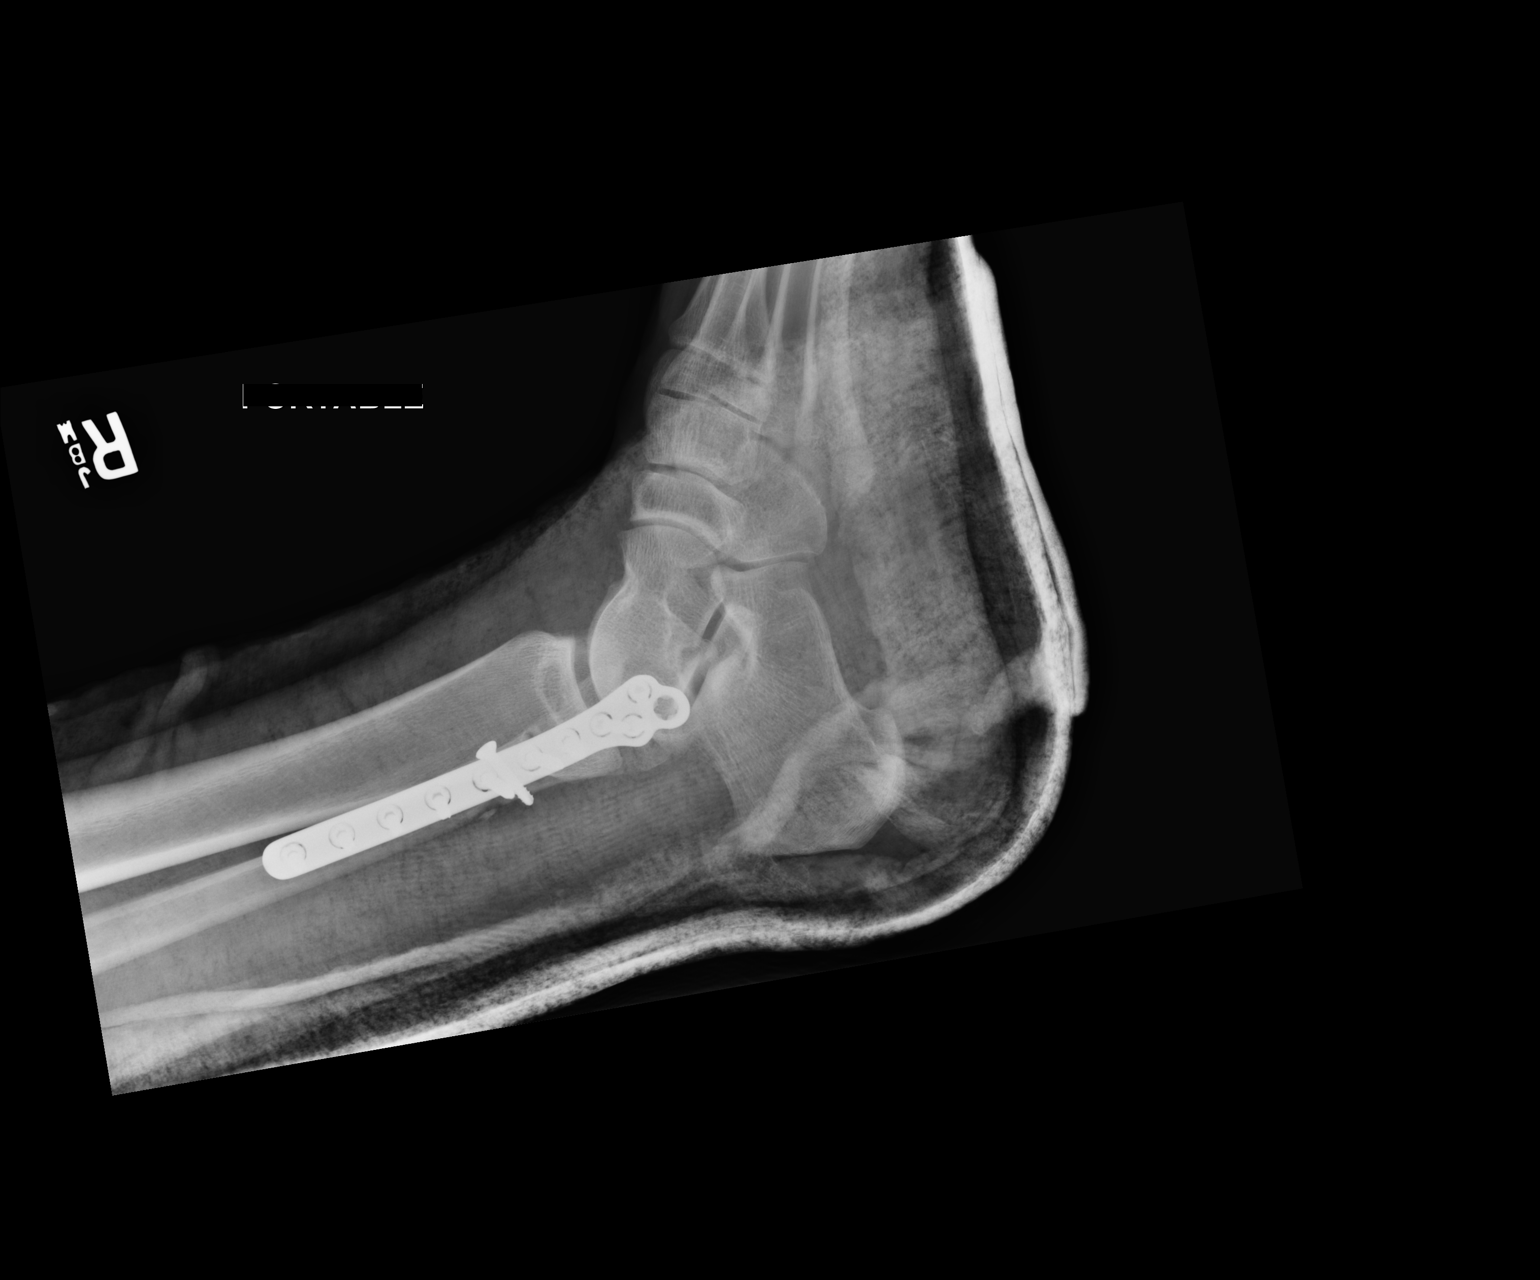

[2 of 2 positions shown; findings below may reference images not displayed]

FINDINGS: A fixation plate and screws noted along the lateral aspect of the
distal fibula. There are 2 screws extending from the fixation plate
of the fibula and traversing into the distal tibia. There is
anatomic alignment of the fibula. The fixation plate is intact. No
new fracture identified. There is no dislocation. A partial cast is
noted.
IMPRESSION: Postsurgical changes of internal fixation of distal fibular fracture
with fixation plate and screws.
